# Patient Record
Sex: Female | Born: 1990 | Race: White | Hispanic: No | Marital: Married | Smoking: Never smoker
Health system: Southern US, Community
[De-identification: ages and names within clinical notes are randomized; demographics above are authoritative.]

## PROBLEM LIST (undated history)

## (undated) DIAGNOSIS — E039 Hypothyroidism, unspecified: Secondary | ICD-10-CM

## (undated) HISTORY — DX: Hypothyroidism, unspecified: E03.9

## (undated) HISTORY — PX: DILATION AND CURETTAGE OF UTERUS: SHX78

---

## 2018-10-02 ENCOUNTER — Other Ambulatory Visit: Payer: Self-pay | Admitting: Obstetrics and Gynecology

## 2018-10-02 DIAGNOSIS — N644 Mastodynia: Secondary | ICD-10-CM

## 2018-10-10 ENCOUNTER — Other Ambulatory Visit: Payer: Self-pay

## 2018-10-10 ENCOUNTER — Ambulatory Visit
Admission: RE | Admit: 2018-10-10 | Discharge: 2018-10-10 | Disposition: A | Discharge status: Home / Self Care | Payer: Federal, State, Local not specified - PPO | Source: Ambulatory Visit | Attending: Obstetrics and Gynecology | Admitting: Obstetrics and Gynecology

## 2018-10-10 DIAGNOSIS — N644 Mastodynia: Secondary | ICD-10-CM

## 2018-10-23 ENCOUNTER — Other Ambulatory Visit (HOSPITAL_COMMUNITY)
Admission: RE | Admit: 2018-10-23 | Discharge: 2018-10-23 | Disposition: A | Discharge status: Home / Self Care | Payer: Federal, State, Local not specified - PPO | Source: Ambulatory Visit | Attending: Obstetrics and Gynecology | Admitting: Obstetrics and Gynecology

## 2018-10-23 ENCOUNTER — Other Ambulatory Visit: Payer: Self-pay | Admitting: Obstetrics and Gynecology

## 2018-10-23 DIAGNOSIS — Z3480 Encounter for supervision of other normal pregnancy, unspecified trimester: Secondary | ICD-10-CM | POA: Diagnosis present

## 2018-10-23 LAB — OB RESULTS CONSOLE HEPATITIS B SURFACE ANTIGEN: Hepatitis B Surface Ag: NEGATIVE

## 2018-10-23 LAB — OB RESULTS CONSOLE RPR: RPR: NONREACTIVE

## 2018-10-23 LAB — OB RESULTS CONSOLE ABO/RH: RH Type: POSITIVE

## 2018-10-23 LAB — OB RESULTS CONSOLE GC/CHLAMYDIA
Chlamydia: NEGATIVE
Gonorrhea: NEGATIVE

## 2018-10-23 LAB — OB RESULTS CONSOLE HIV ANTIBODY (ROUTINE TESTING): HIV: NONREACTIVE

## 2018-10-23 LAB — OB RESULTS CONSOLE ANTIBODY SCREEN: Antibody Screen: NEGATIVE

## 2018-10-23 LAB — OB RESULTS CONSOLE RUBELLA ANTIBODY, IGM: Rubella: IMMUNE

## 2018-10-25 LAB — CYTOLOGY - PAP
Chlamydia: NEGATIVE
Diagnosis: NEGATIVE
Neisseria Gonorrhea: NEGATIVE

## 2019-02-28 NOTE — L&D Delivery Note (Signed)
Delivery Note At 7:06 AM a viable female was delivered via Vaginal, Spontaneous (Presentation: Left Occiput Anterior).  APGAR: 9, 9; weight 9 lb 6.1 oz (4255 g).   Placenta status: Spontaneous, Intact.  Cord: 3 vessels with the following complications: None.  Cord pH: NA  Anesthesia: None Episiotomy: None Lacerations: 1st degree Suture Repair: 3.0 vicryl Est. Blood Loss (mL): 200  Mom to postpartum.  Baby to Couplet care / Skin to Skin  .  Gerald Leitz 06/05/2019, 5:43 PM

## 2019-04-09 DIAGNOSIS — E039 Hypothyroidism, unspecified: Secondary | ICD-10-CM | POA: Diagnosis not present

## 2019-04-15 DIAGNOSIS — Z23 Encounter for immunization: Secondary | ICD-10-CM | POA: Diagnosis not present

## 2019-05-13 DIAGNOSIS — Z3483 Encounter for supervision of other normal pregnancy, third trimester: Secondary | ICD-10-CM | POA: Diagnosis not present

## 2019-06-03 ENCOUNTER — Encounter (HOSPITAL_COMMUNITY): Payer: Self-pay | Admitting: *Deleted

## 2019-06-03 ENCOUNTER — Telehealth (HOSPITAL_COMMUNITY): Payer: Self-pay | Admitting: *Deleted

## 2019-06-03 NOTE — Telephone Encounter (Signed)
Preadmission screen  

## 2019-06-05 ENCOUNTER — Inpatient Hospital Stay (HOSPITAL_COMMUNITY)
Admission: AD | Admit: 2019-06-05 | Discharge: 2019-06-06 | DRG: 807 | Disposition: A | Discharge status: Home / Self Care | Payer: Federal, State, Local not specified - PPO | Attending: Obstetrics and Gynecology | Admitting: Obstetrics and Gynecology

## 2019-06-05 ENCOUNTER — Encounter (HOSPITAL_COMMUNITY): Payer: Self-pay | Admitting: Obstetrics and Gynecology

## 2019-06-05 ENCOUNTER — Other Ambulatory Visit: Payer: Self-pay

## 2019-06-05 DIAGNOSIS — E039 Hypothyroidism, unspecified: Secondary | ICD-10-CM | POA: Diagnosis not present

## 2019-06-05 DIAGNOSIS — O99284 Endocrine, nutritional and metabolic diseases complicating childbirth: Principal | ICD-10-CM | POA: Diagnosis present

## 2019-06-05 DIAGNOSIS — Z20822 Contact with and (suspected) exposure to covid-19: Secondary | ICD-10-CM | POA: Diagnosis present

## 2019-06-05 DIAGNOSIS — Z3A39 39 weeks gestation of pregnancy: Secondary | ICD-10-CM | POA: Diagnosis not present

## 2019-06-05 DIAGNOSIS — O26893 Other specified pregnancy related conditions, third trimester: Secondary | ICD-10-CM | POA: Diagnosis not present

## 2019-06-05 LAB — CBC
HCT: 36 % (ref 36.0–46.0)
Hemoglobin: 11 g/dL — ABNORMAL LOW (ref 12.0–15.0)
MCH: 24.9 pg — ABNORMAL LOW (ref 26.0–34.0)
MCHC: 30.6 g/dL (ref 30.0–36.0)
MCV: 81.4 fL (ref 80.0–100.0)
Platelets: 172 10*3/uL (ref 150–400)
RBC: 4.42 MIL/uL (ref 3.87–5.11)
RDW: 15.8 % — ABNORMAL HIGH (ref 11.5–15.5)
WBC: 10.9 10*3/uL — ABNORMAL HIGH (ref 4.0–10.5)
nRBC: 0 % (ref 0.0–0.2)

## 2019-06-05 LAB — ABO/RH: ABO/RH(D): O POS

## 2019-06-05 LAB — RESPIRATORY PANEL BY RT PCR (FLU A&B, COVID)
Influenza A by PCR: NEGATIVE
Influenza B by PCR: NEGATIVE
SARS Coronavirus 2 by RT PCR: NEGATIVE

## 2019-06-05 LAB — RPR: RPR Ser Ql: NONREACTIVE

## 2019-06-05 LAB — TYPE AND SCREEN
ABO/RH(D): O POS
Antibody Screen: NEGATIVE

## 2019-06-05 MED ORDER — METHYLERGONOVINE MALEATE 0.2 MG/ML IJ SOLN: 0.2000 mg | INTRAMUSCULAR | Status: DC | PRN

## 2019-06-05 MED ORDER — SOD CITRATE-CITRIC ACID 500-334 MG/5ML PO SOLN: 30.0000 mL | ORAL | Status: DC | PRN

## 2019-06-05 MED ORDER — ZOLPIDEM TARTRATE 5 MG PO TABS: 5.0000 mg | ORAL_TABLET | Freq: Every evening | ORAL | Status: DC | PRN

## 2019-06-05 MED ORDER — OXYTOCIN BOLUS FROM INFUSION
500.0000 mL | Freq: Once | INTRAVENOUS | Status: AC
  Administered 2019-06-05: 07:00:00 500 mL via INTRAVENOUS

## 2019-06-05 MED ORDER — ONDANSETRON HCL 4 MG/2ML IJ SOLN
4.0000 mg | Freq: Four times a day (QID) | INTRAMUSCULAR | Status: DC | PRN
  Administered 2019-06-05: 05:00:00 4 mg via INTRAVENOUS
  Filled 2019-06-05 (×2): qty 2

## 2019-06-05 MED ORDER — DIBUCAINE (PERIANAL) 1 % EX OINT: 1.0000 "application " | TOPICAL_OINTMENT | CUTANEOUS | Status: DC | PRN

## 2019-06-05 MED ORDER — COCONUT OIL OIL: 1.0000 "application " | TOPICAL_OIL | Status: DC | PRN

## 2019-06-05 MED ORDER — SIMETHICONE 80 MG PO CHEW: 80.0000 mg | CHEWABLE_TABLET | ORAL | Status: DC | PRN

## 2019-06-05 MED ORDER — SENNOSIDES-DOCUSATE SODIUM 8.6-50 MG PO TABS
2.0000 | ORAL_TABLET | ORAL | Status: DC
  Administered 2019-06-05: 2 via ORAL
  Filled 2019-06-05: qty 2

## 2019-06-05 MED ORDER — OXYCODONE-ACETAMINOPHEN 5-325 MG PO TABS
1.0000 | ORAL_TABLET | ORAL | Status: DC | PRN
  Administered 2019-06-05: 1 via ORAL
  Filled 2019-06-05: qty 1

## 2019-06-05 MED ORDER — PRENATAL MULTIVITAMIN CH
1.0000 | ORAL_TABLET | Freq: Every day | ORAL | Status: DC
  Administered 2019-06-05 – 2019-06-06 (×2): 1 via ORAL
  Filled 2019-06-05 (×2): qty 1

## 2019-06-05 MED ORDER — IBUPROFEN 600 MG PO TABS
600.0000 mg | ORAL_TABLET | Freq: Four times a day (QID) | ORAL | Status: DC
  Administered 2019-06-05 – 2019-06-06 (×5): 600 mg via ORAL
  Filled 2019-06-05 (×5): qty 1

## 2019-06-05 MED ORDER — ACETAMINOPHEN 325 MG PO TABS
650.0000 mg | ORAL_TABLET | ORAL | Status: DC | PRN
  Administered 2019-06-05: 650 mg via ORAL
  Filled 2019-06-05: qty 2

## 2019-06-05 MED ORDER — ONDANSETRON HCL 4 MG PO TABS
4.0000 mg | ORAL_TABLET | ORAL | Status: DC | PRN
  Administered 2019-06-05: 12:00:00 4 mg via ORAL
  Filled 2019-06-05: qty 1

## 2019-06-05 MED ORDER — WITCH HAZEL-GLYCERIN EX PADS: 1.0000 "application " | MEDICATED_PAD | CUTANEOUS | Status: DC | PRN

## 2019-06-05 MED ORDER — LIDOCAINE HCL (PF) 1 % IJ SOLN
30.0000 mL | INTRAMUSCULAR | Status: AC | PRN
  Administered 2019-06-05: 07:00:00 30 mL via SUBCUTANEOUS
  Filled 2019-06-05: qty 30

## 2019-06-05 MED ORDER — LACTATED RINGERS IV SOLN: INTRAVENOUS | Status: DC

## 2019-06-05 MED ORDER — MAGNESIUM HYDROXIDE 400 MG/5ML PO SUSP: 30.0000 mL | ORAL | Status: DC | PRN

## 2019-06-05 MED ORDER — ACETAMINOPHEN 325 MG PO TABS: 650.0000 mg | ORAL_TABLET | ORAL | Status: DC | PRN

## 2019-06-05 MED ORDER — ONDANSETRON HCL 4 MG/2ML IJ SOLN
4.0000 mg | Freq: Once | INTRAMUSCULAR | Status: AC
  Administered 2019-06-05: 4 mg via INTRAVENOUS

## 2019-06-05 MED ORDER — LACTATED RINGERS IV SOLN: 500.0000 mL | INTRAVENOUS | Status: DC | PRN

## 2019-06-05 MED ORDER — FLEET ENEMA 7-19 GM/118ML RE ENEM: 1.0000 | ENEMA | RECTAL | Status: DC | PRN

## 2019-06-05 MED ORDER — OXYTOCIN 40 UNITS IN NORMAL SALINE INFUSION - SIMPLE MED
2.5000 [IU]/h | INTRAVENOUS | Status: DC
  Filled 2019-06-05: qty 1000

## 2019-06-05 MED ORDER — DIPHENHYDRAMINE HCL 25 MG PO CAPS: 25.0000 mg | ORAL_CAPSULE | Freq: Four times a day (QID) | ORAL | Status: DC | PRN

## 2019-06-05 MED ORDER — PROMETHAZINE HCL 25 MG/ML IJ SOLN
12.5000 mg | INTRAMUSCULAR | Status: DC | PRN
  Administered 2019-06-05: 12.5 mg via INTRAVENOUS
  Filled 2019-06-05: qty 1

## 2019-06-05 MED ORDER — BENZOCAINE-MENTHOL 20-0.5 % EX AERO
1.0000 "application " | INHALATION_SPRAY | CUTANEOUS | Status: DC | PRN
  Administered 2019-06-05: 1 via TOPICAL
  Filled 2019-06-05: qty 56

## 2019-06-05 MED ORDER — OXYCODONE-ACETAMINOPHEN 5-325 MG PO TABS: 2.0000 | ORAL_TABLET | ORAL | Status: DC | PRN

## 2019-06-05 MED ORDER — METHYLERGONOVINE MALEATE 0.2 MG PO TABS: 0.2000 mg | ORAL_TABLET | ORAL | Status: DC | PRN

## 2019-06-05 MED ORDER — FENTANYL CITRATE (PF) 100 MCG/2ML IJ SOLN: 50.0000 ug | INTRAMUSCULAR | Status: DC | PRN

## 2019-06-05 MED ORDER — ONDANSETRON HCL 4 MG/2ML IJ SOLN: 4.0000 mg | INTRAMUSCULAR | Status: DC | PRN

## 2019-06-05 NOTE — MAU Note (Signed)
Covid swab obtained without difficulty and pt tol well. No symptoms 

## 2019-06-05 NOTE — MAU Note (Signed)
.   Caitlin Aguirre is a 29 y.o. at [redacted]w[redacted]d here in MAU reporting: Ctx that started around 0130 this morning. No VB or LOF. Patient reporting good fetal movement.   Vitals:   06/05/19 0324  BP: 115/64  Pulse: 71  Resp: 19  Temp: 97.7 F (36.5 C)  SpO2: 100%     FHT:140 Lab orders placed from triage:

## 2019-06-05 NOTE — H&P (Signed)
Caitlin Aguirre is a 29 y.o. female presenting for active labor. Presented toMAU and was 5 cm progressed rapidly to complete. No LOF + bloody show +FM.Marland Kitchenpregnancy has been uncomplicated. Prenatal care provided by Dr. Gerald Leitz with [facility]  OB History    Gravida  5   Para  3   Term  3   Preterm      AB  2   Living  3     SAB  2   TAB      Ectopic      Multiple  0   Live Births  3        Obstetric Comments  1500 cc blood loss from 2019 pregnancy, then retained placenta-led to [facility]        Past Medical History:  Diagnosis Date  . Hypothyroidism    Past Surgical History:  Procedure Laterality Date  . DILATION AND CURETTAGE OF UTERUS     Family History: family history is not on file. Social History:  reports previous alcohol use. She reports that she does not use drugs. No history on file for tobacco.     Maternal Diabetes: No Genetic Screening: Normal Maternal Ultrasounds/Referrals: Normal Fetal Ultrasounds or other Referrals:  None Maternal Substance Abuse:  No Significant Maternal Medications:  Meds include: Syntroid Significant Maternal Lab Results:  Group B Strep negative Other Comments:  None  Review of Systems  Constitutional: Negative.   HENT: Negative.   Eyes: Negative.   Respiratory: Negative.   Cardiovascular: Negative.   Gastrointestinal: Negative.   Endocrine: Negative.   Genitourinary: Negative.   Musculoskeletal: Negative.   Skin: Negative.   Allergic/Immunologic: Negative.   Neurological: Negative.   Hematological: Negative.   Psychiatric/Behavioral: Negative.    Maternal Medical History:  Reason for admission: Contractions.   Contractions: Onset was 6-12 hours ago.   Frequency: regular.   Perceived severity is strong.    Fetal activity: Perceived fetal activity is normal.   Last perceived fetal movement was within the past hour.    Prenatal complications: no prenatal complications Prenatal Complications - Diabetes:  none.    Dilation: 10 Effacement (%): 100 Station: 0 Exam by:: Lajuana Matte, RN Blood pressure 112/64, pulse 74, temperature 98.1 F (36.7 C), temperature source Oral, resp. rate 16, height 5\' 10"  (1.778 m), weight 93.4 kg, last menstrual period 09/01/2018, SpO2 99 %, unknown if currently breastfeeding. Exam Physical Exam  Vitals reviewed. Constitutional: She is oriented to person, place, and time. She appears well-developed and well-nourished.  HENT:  Head: Normocephalic and atraumatic.  Eyes: Pupils are equal, round, and reactive to light. Conjunctivae are normal.  Cardiovascular: Normal rate and regular rhythm.  Respiratory: Effort normal and breath sounds normal.  GI: There is no abdominal tenderness.  Genitourinary:    Vagina normal.   Musculoskeletal:        General: Edema present. Normal range of motion.     Cervical back: Normal range of motion.  Neurological: She is alert and oriented to person, place, and time.  Skin: Skin is warm and dry.  Psychiatric: She has a normal mood and affect.    Prenatal labs: ABO, Rh: --/--/O POS, O POS Performed at [facility], [address] [place], [place]  [phone]) Antibody: NEG (04/08 0510) Rubella: Immune (08/26 0000) RPR: NON REACTIVE (04/08 0515)  HBsAg: Negative (08/26 0000)  HIV: Non-reactive (08/26 0000)  GBS:   Negative   Assessment/Plan: Term Pregnancy - Active labor  Complete and ready to push upon by arrival .  Please see delivery summary Pain control - none Hypothyroidism- synthroid  Christophe Louis 06/05/2019, 5:38 PM

## 2019-06-05 NOTE — Lactation Note (Signed)
This note was copied from a baby's chart. Lactation Consultation Note  Patient Name: Caitlin Aguirre [id number] Date: 06/05/2019 Reason for consult: Initial assessment;Term;Maternal endocrine disorder Type of Endocrine Disorder?: Thyroid   Experienced with breastfeeding first 2 children for >1 yr.  LC in to visit with P3 Mom of term baby at 63 hrs old.  Baby has breastfed 3 times so far and Mom reports feeling some tenderness during initial latch.  Mom has small breasts with erect nipples, no trauma noted to nipples.   Reviewed breast massage and hand expression.  Mom has an easy flow of colostrum noted.    Baby swaddled loosely in crib and stirring awake.  Changed a void and large stool (transitional).  Baby given to Mom STS.  Mom using cradle hold and baby latched shallow.  Offered to assist with a deeper latch to breast.  Added pillow support under baby, and assisted Mom to use cross cradle hold and baby latched deeper and swallows identified almost immediately.   Encouraged continued STS and offering the breast often with cues.    Lactation brochure given and Mom aware of IP and OP lactation support available to her.  Mom to call prn.   Maternal Data Formula Feeding for Exclusion: No Has patient been taught Hand Expression?: Yes Does the patient have breastfeeding experience prior to this delivery?: Yes  Feeding Feeding Type: Breast Fed  LATCH Score Latch: Grasps breast easily, tongue down, lips flanged, rhythmical sucking.  Audible Swallowing: Spontaneous and intermittent  Type of Nipple: Everted at rest and after stimulation  Comfort (Breast/Nipple): Soft / non-tender  Hold (Positioning): Assistance needed to correctly position infant at breast and maintain latch.  LATCH Score: 9  Interventions Interventions: Breast feeding basics reviewed;Assisted with latch;Skin to skin;Breast massage;Hand express;Breast compression;Adjust position;Support pillows;Position  options;Expressed milk   Consult Status Consult Status: Follow-up Date: 06/06/19 Follow-up type: In-patient    [name] 06/05/2019, 3:07 PM

## 2019-06-06 LAB — CBC
HCT: 35.8 % — ABNORMAL LOW (ref 36.0–46.0)
Hemoglobin: 10.7 g/dL — ABNORMAL LOW (ref 12.0–15.0)
MCH: 24.8 pg — ABNORMAL LOW (ref 26.0–34.0)
MCHC: 29.9 g/dL — ABNORMAL LOW (ref 30.0–36.0)
MCV: 82.9 fL (ref 80.0–100.0)
Platelets: 234 10*3/uL (ref 150–400)
RBC: 4.32 MIL/uL (ref 3.87–5.11)
RDW: 16 % — ABNORMAL HIGH (ref 11.5–15.5)
WBC: 15.9 10*3/uL — ABNORMAL HIGH (ref 4.0–10.5)
nRBC: 0 % (ref 0.0–0.2)

## 2019-06-06 MED ORDER — IBUPROFEN 600 MG PO TABS: 600.0000 mg | ORAL_TABLET | Freq: Four times a day (QID) | ORAL | 0 refills | Status: DC

## 2019-06-06 NOTE — Lactation Note (Signed)
This note was copied from a baby's chart. Lactation Consultation Note  Patient Name: Caitlin Aguirre [id number] Date: 06/06/2019    Gaylyn Rong, RN says that Mom declines a lactation visit before going home.   Lurline Hare [facility] 06/06/2019, 11:24 AM

## 2019-06-06 NOTE — Discharge Instructions (Signed)

## 2019-06-06 NOTE — Discharge Summary (Signed)
OB Discharge Summary     Patient Name: Caitlin Aguirre DOB: March 17, 1990 MRN: [id number]  Date of admission: 06/05/2019 Delivering MD: Gerald Leitz   Date of discharge: 06/06/2019  Admitting diagnosis: Normal labor and delivery [O80] Vaginal delivery [O80] Intrauterine pregnancy: [redacted]w[redacted]d     Secondary diagnosis:  Active Problems:   Normal labor and delivery   Vaginal delivery  Additional problems: hypothyroid     Discharge diagnosis: Term Pregnancy Delivered                                                                                                Post partum procedures:none  Augmentation: none  Complications: None  Hospital course:  Onset of Labor With Vaginal Delivery     29 y.o. yo [id number] at [redacted]w[redacted]d was admitted in Active Labor on 06/05/2019. Patient had an uncomplicated labor course as follows:  Membrane Rupture Time/Date: 6:50 AM ,06/05/2019   Intrapartum Procedures: Episiotomy: None [1]                                         Lacerations:  1st degree [2]  Patient had a delivery of a Viable infant. 06/05/2019  Information for the patient's newborn:  Jakyah, Bradby [[id number]]  Delivery Method: Vaginal, Spontaneous(Filed from Delivery Summary)     Pateint had an uncomplicated postpartum course.  She is ambulating, tolerating a regular diet, passing flatus, and urinating well. Patient is discharged home in stable condition on 06/06/19.   Physical exam  Vitals:   06/05/19 1600 06/05/19 2000 06/06/19 0000 06/06/19 0500  BP: 112/64 115/72 110/63 106/69  Pulse: 74 (!) 58 73 75  Resp: 16 18 18 16   Temp: 98.1 F (36.7 C) 98.1 F (36.7 C) 99.5 F (37.5 C) 97.9 F (36.6 C)  TempSrc: Oral Oral Oral Oral  SpO2: 99% 99% 97% 98%  Weight:      Height:       General: alert, cooperative and no distress Lochia: appropriate Uterine Fundus: firm Incision: N/A DVT Evaluation: No evidence of DVT seen on physical exam. Labs: Lab Results  Component Value Date   WBC 15.9 (H)  06/06/2019   HGB 10.7 (L) 06/06/2019   HCT 35.8 (L) 06/06/2019   MCV 82.9 06/06/2019   PLT 234 06/06/2019   No flowsheet data found.  Discharge instruction: per After Visit Summary and "Baby and Me Booklet".  After visit meds:  Allergies as of 06/06/2019   No Known Allergies     Medication List    TAKE these medications   ibuprofen 600 MG tablet Commonly known as: ADVIL Take 1 tablet (600 mg total) by mouth every 6 (six) hours.   levothyroxine 25 MCG tablet Commonly known as: SYNTHROID Take 25 mcg by mouth daily before breakfast.   prenatal multivitamin Tabs tablet Take 1 tablet by mouth daily at 12 noon.       Diet: routine diet  Activity: Advance as tolerated. Pelvic rest for 6 weeks.   Outpatient follow up:6 weeks Follow  up Appt: Future Appointments  Date Time Provider [place]  06/14/2019  9:45 AM [facility] None   Follow up Visit:No follow-ups on file.  Postpartum contraception: Not Discussed  Newborn Data: Live born female  Birth Weight: 9 lb 6.1 oz (4255 g) APGAR: 9, 9  Newborn Delivery   Birth date/time: 06/05/2019 07:06:00 Delivery type: Vaginal, Spontaneous      Baby Feeding: Breast Disposition:home with mother   06/06/2019 Annalee Genta, DO

## 2019-06-14 ENCOUNTER — Other Ambulatory Visit (HOSPITAL_COMMUNITY): Payer: Federal, State, Local not specified - PPO

## 2019-06-16 ENCOUNTER — Inpatient Hospital Stay (HOSPITAL_COMMUNITY): Payer: Federal, State, Local not specified - PPO

## 2019-06-16 ENCOUNTER — Inpatient Hospital Stay (HOSPITAL_COMMUNITY)
Admission: AD | Admit: 2019-06-16 | Payer: Federal, State, Local not specified - PPO | Source: Home / Self Care | Admitting: Obstetrics and Gynecology

## 2019-07-15 DIAGNOSIS — Z8742 Personal history of other diseases of the female genital tract: Secondary | ICD-10-CM | POA: Diagnosis not present

## 2019-08-01 DIAGNOSIS — N61 Mastitis without abscess: Secondary | ICD-10-CM | POA: Diagnosis not present

## 2019-11-04 DIAGNOSIS — E039 Hypothyroidism, unspecified: Secondary | ICD-10-CM | POA: Diagnosis not present

## 2019-11-04 DIAGNOSIS — Z1321 Encounter for screening for nutritional disorder: Secondary | ICD-10-CM | POA: Diagnosis not present

## 2019-11-04 DIAGNOSIS — Z6824 Body mass index (BMI) 24.0-24.9, adult: Secondary | ICD-10-CM | POA: Diagnosis not present

## 2019-12-08 DIAGNOSIS — Z20828 Contact with and (suspected) exposure to other viral communicable diseases: Secondary | ICD-10-CM | POA: Diagnosis not present

## 2019-12-08 DIAGNOSIS — R051 Acute cough: Secondary | ICD-10-CM | POA: Diagnosis not present

## 2019-12-08 DIAGNOSIS — J069 Acute upper respiratory infection, unspecified: Secondary | ICD-10-CM | POA: Diagnosis not present

## 2020-03-09 DIAGNOSIS — U071 COVID-19: Secondary | ICD-10-CM | POA: Diagnosis not present

## 2020-03-09 DIAGNOSIS — Z20828 Contact with and (suspected) exposure to other viral communicable diseases: Secondary | ICD-10-CM | POA: Diagnosis not present

## 2020-03-09 DIAGNOSIS — H1033 Unspecified acute conjunctivitis, bilateral: Secondary | ICD-10-CM | POA: Diagnosis not present

## 2020-07-30 ENCOUNTER — Other Ambulatory Visit: Payer: Self-pay | Admitting: Family Medicine

## 2020-07-30 DIAGNOSIS — M79604 Pain in right leg: Secondary | ICD-10-CM | POA: Diagnosis not present

## 2020-07-30 DIAGNOSIS — R2242 Localized swelling, mass and lump, left lower limb: Secondary | ICD-10-CM | POA: Diagnosis not present

## 2020-07-30 DIAGNOSIS — M79605 Pain in left leg: Secondary | ICD-10-CM | POA: Diagnosis not present

## 2020-08-04 DIAGNOSIS — I82812 Embolism and thrombosis of superficial veins of left lower extremities: Secondary | ICD-10-CM | POA: Diagnosis not present

## 2020-08-04 DIAGNOSIS — R2242 Localized swelling, mass and lump, left lower limb: Secondary | ICD-10-CM | POA: Diagnosis not present

## 2020-09-10 ENCOUNTER — Other Ambulatory Visit: Payer: Self-pay

## 2020-09-10 DIAGNOSIS — I872 Venous insufficiency (chronic) (peripheral): Secondary | ICD-10-CM

## 2020-09-17 ENCOUNTER — Other Ambulatory Visit: Payer: Self-pay

## 2020-09-17 ENCOUNTER — Ambulatory Visit: Payer: Federal, State, Local not specified - PPO | Admitting: Physician Assistant

## 2020-09-17 ENCOUNTER — Ambulatory Visit (HOSPITAL_COMMUNITY)
Admission: RE | Admit: 2020-09-17 | Discharge: 2020-09-17 | Disposition: A | Discharge status: Home / Self Care | Payer: Federal, State, Local not specified - PPO | Source: Ambulatory Visit | Attending: Vascular Surgery | Admitting: Vascular Surgery

## 2020-09-17 VITALS — BP 107/65 | HR 67 | Temp 98.2°F | Resp 20 | Ht 70.0 in | Wt 155.8 lb

## 2020-09-17 DIAGNOSIS — I872 Venous insufficiency (chronic) (peripheral): Secondary | ICD-10-CM | POA: Diagnosis not present

## 2020-09-17 DIAGNOSIS — I83812 Varicose veins of left lower extremities with pain: Secondary | ICD-10-CM

## 2020-09-17 NOTE — Progress Notes (Signed)
Office Note     CC:  follow up Requesting Provider:  Eber Jones, NP  HPI: Caitlin Aguirre is a 30 y.o. (01-06-1991) female who presents for evaluation of varicosities of left lower extremity.  Patient describes a painful episode lasting 5 days in a focal area of her medial left thigh which she believes is a varicose vein.  This affected her ability to walk and she was worried about blood clots.  She denies any history of DVT, venous ulcerations, trauma, or prior vascular intervention.  She has a strong family history of varicose veins in both sides of her family.  She denies tobacco use.   Past Medical History:  Diagnosis Date   Hypothyroidism     Past Surgical History:  Procedure Laterality Date   DILATION AND CURETTAGE OF UTERUS      Social History   Socioeconomic History   Marital status: Unknown    Spouse name: Not on file   Number of children: Not on file   Years of education: Not on file   Highest education level: Not on file  Occupational History   Not on file  Tobacco Use   Smoking status: Never   Smokeless tobacco: Never  Substance and Sexual Activity   Alcohol use: Not Currently   Drug use: Never   Sexual activity: Not on file  Other Topics Concern   Not on file  Social History Narrative   Not on file   Social Determinants of Health   Financial Resource Strain: Not on file  Food Insecurity: Not on file  Transportation Needs: Not on file  Physical Activity: Not on file  Stress: Not on file  Social Connections: Not on file  Intimate Partner Violence: Not on file   History reviewed. No pertinent family history.  Current Outpatient Medications  Medication Sig Dispense Refill   levothyroxine (SYNTHROID) 25 MCG tablet Take 25 mcg by mouth daily before breakfast.     Prenatal Vit-Fe Fumarate-FA (PRENATAL MULTIVITAMIN) TABS tablet Take 1 tablet by mouth daily at 12 noon.     ibuprofen (ADVIL) 600 MG tablet Take 1 tablet (600 mg total) by mouth every 6  (six) hours. (Patient not taking: Reported on 09/17/2020) 30 tablet 0   No current facility-administered medications for this visit.    No Known Allergies   REVIEW OF SYSTEMS:   [X]  denotes positive finding, [ ]  denotes negative finding Cardiac  Comments:  Chest pain or chest pressure:    Shortness of breath upon exertion:    Short of breath when lying flat:    Irregular heart rhythm:        Vascular    Pain in calf, thigh, or hip brought on by ambulation:    Pain in feet at night that wakes you up from your sleep:     Blood clot in your veins:    Leg swelling:         Pulmonary    Oxygen at home:    Productive cough:     Wheezing:         Neurologic    Sudden weakness in arms or legs:     Sudden numbness in arms or legs:     Sudden onset of difficulty speaking or slurred speech:    Temporary loss of vision in one eye:     Problems with dizziness:         Gastrointestinal    Blood in stool:     Vomited blood:  Genitourinary    Burning when urinating:     Blood in urine:        Psychiatric    Major depression:         Hematologic    Bleeding problems:    Problems with blood clotting too easily:        Skin    Rashes or ulcers:        Constitutional    Fever or chills:      PHYSICAL EXAMINATION:  Vitals:   09/17/20 1415  BP: 107/65  Pulse: 67  Resp: 20  Temp: 98.2 F (36.8 C)  TempSrc: Temporal  SpO2: 98%  Weight: 155 lb 12.8 oz (70.7 kg)  Height: 5\' 10"  (1.778 m)    General:  WDWN in NAD; vital signs documented above Gait: Not observed HENT: WNL, normocephalic Pulmonary: normal non-labored breathing  Cardiac: regular HR Abdomen: soft, NT, no masses Skin: without rashes Vascular Exam/Pulses:  Right Left  Radial 2+ (normal) 2+ (normal)  DP 2+ (normal) 2+ (normal)   Extremities: without ischemic changes, without Gangrene , without cellulitis; without open wounds; varicose vein palpable in medial left thigh as well as in  medial/posterior left calf Musculoskeletal: no muscle wasting or atrophy  Neurologic: A&O X 3;  No focal weakness or paresthesias are detected Psychiatric:  The pt has Normal affect.   Non-Invasive Vascular Imaging:   Incompetent left greater saphenous vein Venous reflux study negative for DVT of left lower extremity   ASSESSMENT/PLAN:: 30 y.o. female here for evaluation of varicose veins of left lower extremity  -Patient describes periodic painful episodes and a focal area of her left medial thigh consistent with superficial thrombophlebitis -Venous reflux study was negative for DVT of left lower extremity however did demonstrate a incompetent left greater saphenous vein -At this time patient is not interested in pursuing any further work-up for incompetent left greater saphenous vein.  She was provided measurements for knee-high compression if she would like to purchase compression stockings in the future.  We also discussed proper leg elevation as well as avoiding prolonged sitting and standing and taking NSAIDs if she were to experience another episode of thrombophlebitis -She may follow-up on a as needed basis for now.  We discussed if she would like to pursue treatment of left greater saphenous vein in the future she would need a current reflux study   38, PA-C [facility] [phone]  Clinic MD:   [phone]

## 2020-10-12 DIAGNOSIS — R519 Headache, unspecified: Secondary | ICD-10-CM | POA: Diagnosis not present

## 2020-10-12 DIAGNOSIS — G9389 Other specified disorders of brain: Secondary | ICD-10-CM | POA: Diagnosis not present

## 2020-10-12 DIAGNOSIS — R197 Diarrhea, unspecified: Secondary | ICD-10-CM | POA: Diagnosis not present

## 2020-10-12 DIAGNOSIS — M542 Cervicalgia: Secondary | ICD-10-CM | POA: Diagnosis not present

## 2020-10-12 DIAGNOSIS — R42 Dizziness and giddiness: Secondary | ICD-10-CM | POA: Diagnosis not present

## 2020-10-12 DIAGNOSIS — G44209 Tension-type headache, unspecified, not intractable: Secondary | ICD-10-CM | POA: Diagnosis not present

## 2021-06-07 DIAGNOSIS — Z3202 Encounter for pregnancy test, result negative: Secondary | ICD-10-CM | POA: Diagnosis not present

## 2021-06-07 DIAGNOSIS — Z6823 Body mass index (BMI) 23.0-23.9, adult: Secondary | ICD-10-CM | POA: Diagnosis not present

## 2021-06-07 DIAGNOSIS — D649 Anemia, unspecified: Secondary | ICD-10-CM | POA: Diagnosis not present

## 2021-06-07 DIAGNOSIS — R5383 Other fatigue: Secondary | ICD-10-CM | POA: Diagnosis not present

## 2021-06-07 DIAGNOSIS — E039 Hypothyroidism, unspecified: Secondary | ICD-10-CM | POA: Diagnosis not present

## 2021-07-06 IMAGING — US ULTRASOUND LEFT BREAST LIMITED
1 series · 2 of 2 positions shown · non-contrast
Comparison: Previous exam(s).

CLINICAL DATA: Patient presents for focal pain for 3-4 weeks and
lump in the left axillary tail. Patient states the lump is no longer
present and the pain is improving.

EXAM:
ULTRASOUND OF THE LEFT BREAST

[Series 1: ultrasound left breast limited · 0.06mm/px · 2 of 2 slices shown]
[im 1/2]
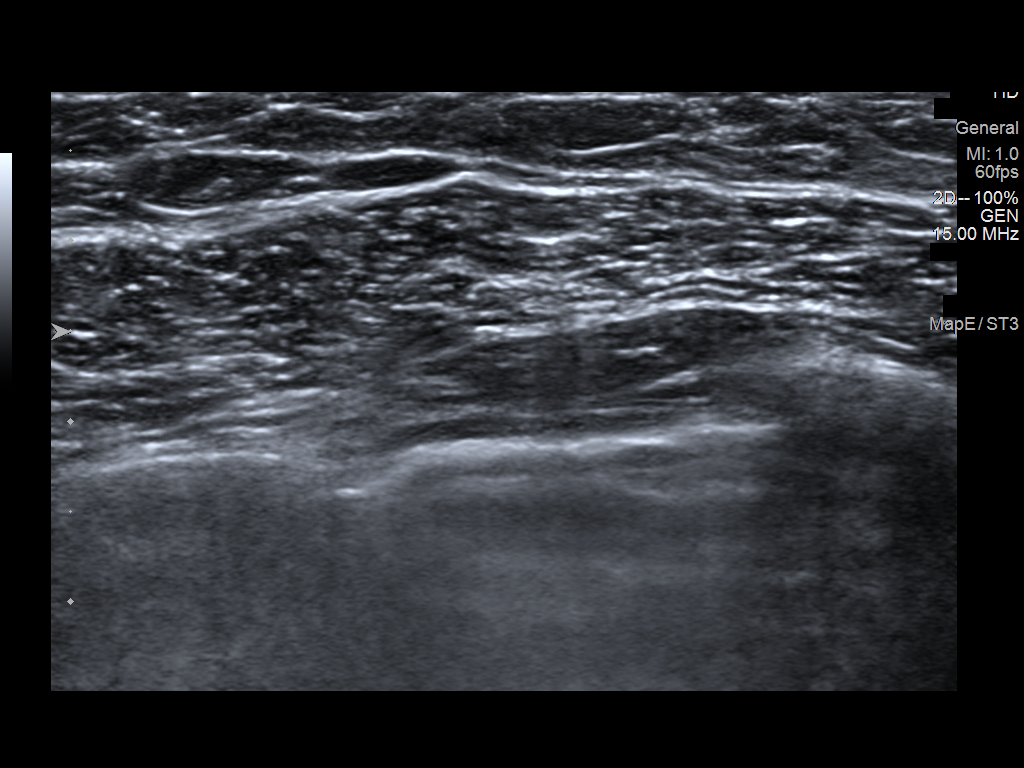
[im 2/2]
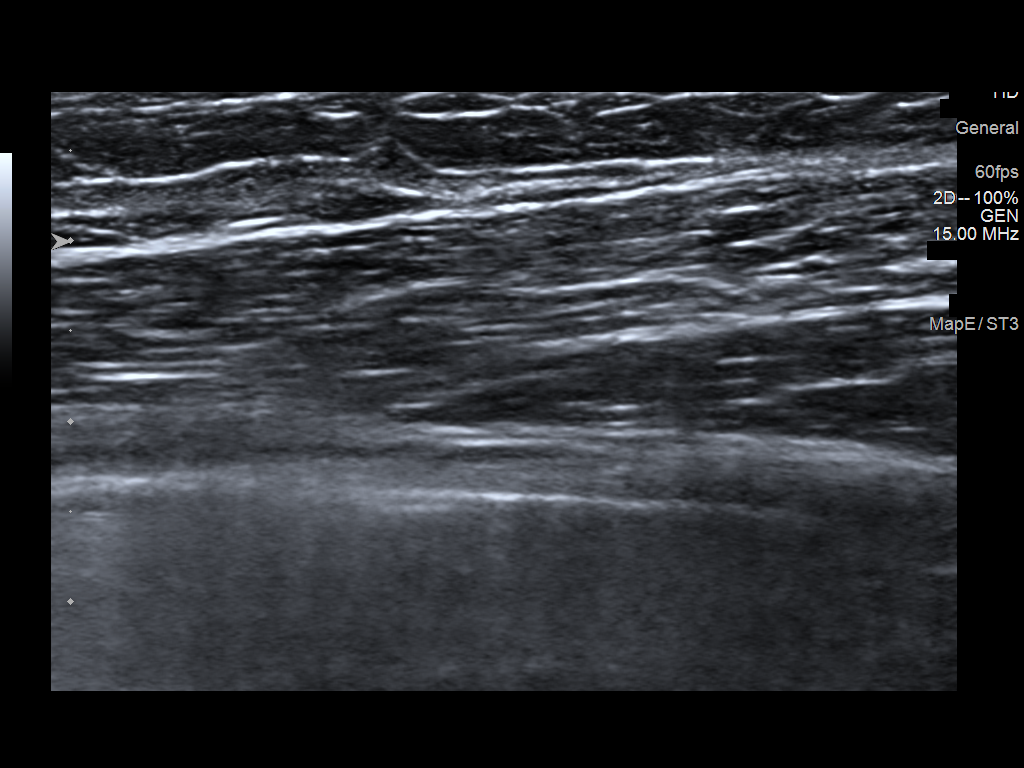

[2 of 2 positions shown; findings below may reference images not displayed]

FINDINGS: On physical exam, I palpated no discrete abnormality in the left
axillary tail.

Targeted ultrasound is performed, showing normal fibroglandular
tissue at 1 o'clock 8 cm from the nipple/axillary tail in the
location of patient's pain and reported lump that has resolved.
IMPRESSION: No sonographic evidence of malignancy in the left breast at the site
of the patient's reported pain and now resolved lump.

RECOMMENDATION:
Recommend further evaluation be performed on a clinical basis.

I have discussed the findings and recommendations with the patient.
Results were also provided in writing at the conclusion of the
visit. If applicable, a reminder letter will be sent to the patient
regarding the next appointment.

BI-RADS CATEGORY  1: Negative.

## 2021-07-26 DIAGNOSIS — Z6824 Body mass index (BMI) 24.0-24.9, adult: Secondary | ICD-10-CM | POA: Diagnosis not present

## 2021-07-26 DIAGNOSIS — D509 Iron deficiency anemia, unspecified: Secondary | ICD-10-CM | POA: Diagnosis not present

## 2021-07-26 DIAGNOSIS — N644 Mastodynia: Secondary | ICD-10-CM | POA: Diagnosis not present

## 2021-09-14 DIAGNOSIS — M5442 Lumbago with sciatica, left side: Secondary | ICD-10-CM | POA: Diagnosis not present

## 2021-09-14 DIAGNOSIS — M546 Pain in thoracic spine: Secondary | ICD-10-CM | POA: Diagnosis not present

## 2021-09-14 DIAGNOSIS — M5413 Radiculopathy, cervicothoracic region: Secondary | ICD-10-CM | POA: Diagnosis not present

## 2021-10-03 DIAGNOSIS — L814 Other melanin hyperpigmentation: Secondary | ICD-10-CM | POA: Diagnosis not present

## 2021-10-03 DIAGNOSIS — D225 Melanocytic nevi of trunk: Secondary | ICD-10-CM | POA: Diagnosis not present

## 2021-10-03 DIAGNOSIS — D485 Neoplasm of uncertain behavior of skin: Secondary | ICD-10-CM | POA: Diagnosis not present

## 2021-10-03 DIAGNOSIS — D2239 Melanocytic nevi of other parts of face: Secondary | ICD-10-CM | POA: Diagnosis not present

## 2021-10-10 DIAGNOSIS — M5413 Radiculopathy, cervicothoracic region: Secondary | ICD-10-CM | POA: Diagnosis not present

## 2021-10-10 DIAGNOSIS — M5442 Lumbago with sciatica, left side: Secondary | ICD-10-CM | POA: Diagnosis not present

## 2021-10-10 DIAGNOSIS — M546 Pain in thoracic spine: Secondary | ICD-10-CM | POA: Diagnosis not present

## 2021-10-17 DIAGNOSIS — M546 Pain in thoracic spine: Secondary | ICD-10-CM | POA: Diagnosis not present

## 2021-10-17 DIAGNOSIS — M5442 Lumbago with sciatica, left side: Secondary | ICD-10-CM | POA: Diagnosis not present

## 2021-10-17 DIAGNOSIS — M5413 Radiculopathy, cervicothoracic region: Secondary | ICD-10-CM | POA: Diagnosis not present

## 2021-10-24 DIAGNOSIS — M5413 Radiculopathy, cervicothoracic region: Secondary | ICD-10-CM | POA: Diagnosis not present

## 2021-10-24 DIAGNOSIS — M5442 Lumbago with sciatica, left side: Secondary | ICD-10-CM | POA: Diagnosis not present

## 2021-10-24 DIAGNOSIS — M546 Pain in thoracic spine: Secondary | ICD-10-CM | POA: Diagnosis not present

## 2021-11-04 ENCOUNTER — Telehealth: Payer: Self-pay

## 2021-11-04 DIAGNOSIS — I83812 Varicose veins of left lower extremities with pain: Secondary | ICD-10-CM

## 2021-11-04 DIAGNOSIS — I872 Venous insufficiency (chronic) (peripheral): Secondary | ICD-10-CM

## 2021-11-04 NOTE — Telephone Encounter (Signed)
Pt called stating that she had a blood clot in her leg and was having significant pain.  Reviewed pt's chart, returned call for clarification, two identifiers used. Informed pt that her last [place] in 7/22 showed no DVT, but an incompetent GSV. She stated that she also has a lump that she feels. She denies any redness, hot to the touch area, or severe debilitating pain. Appts sch for [place] and PA to meet schedule with husband and childcare. Confirmed understanding.

## 2021-11-08 DIAGNOSIS — M5413 Radiculopathy, cervicothoracic region: Secondary | ICD-10-CM | POA: Diagnosis not present

## 2021-11-08 DIAGNOSIS — M546 Pain in thoracic spine: Secondary | ICD-10-CM | POA: Diagnosis not present

## 2021-11-08 DIAGNOSIS — M5442 Lumbago with sciatica, left side: Secondary | ICD-10-CM | POA: Diagnosis not present

## 2021-11-10 ENCOUNTER — Encounter (HOSPITAL_COMMUNITY): Payer: Federal, State, Local not specified - PPO

## 2021-11-10 ENCOUNTER — Ambulatory Visit: Payer: Federal, State, Local not specified - PPO

## 2021-11-25 NOTE — Progress Notes (Unsigned)
Requested by:  [facility], [place],  [place] [address]     History of Present Illness   Caitlin Aguirre is a 31 y.o. (1990/06/20) female who presents for reevaluation of varicose veins. She was seen July of 2022 with concerns about painful varicose veins and concerns about DVT. No DVT was found but she did have an incompetent GSV by duplex. She was measured for 20-30 mmHg thigh high compression stockings but she explains she did not purchase any from here. At the time she tried to order some online and the pair she got she says she did not tolerate wearing. The ablation was discussed with her at that visit but she was at the time not interested in any further treatment. Now she explains that her veins have been causing her more pain. She reports stabbing/ stinging pains intermittently and also dull aching in the left leg. The pain is mostly in area of bulging varicose veins in medial thigh and medial left calf. She says the pain occurs all throughout day. She is up on her legs a lot with three young kids. She infrequently elevates and does not wear compression. She does have family history of venous disease but no DVT.   Past Medical History:  Diagnosis Date   Hypothyroidism     Past Surgical History:  Procedure Laterality Date   DILATION AND CURETTAGE OF UTERUS      Social History   Socioeconomic History   Marital status: Married    Spouse name: Not on file   Number of children: Not on file   Years of education: Not on file   Highest education level: Not on file  Occupational History   Not on file  Tobacco Use   Smoking status: Never   Smokeless tobacco: Never  Substance and Sexual Activity   Alcohol use: Not Currently   Drug use: Never   Sexual activity: Not on file  Other Topics Concern   Not on file  Social History Narrative   Not on file   Social Determinants of Health   Financial Resource Strain: Not on file  Food Insecurity: Not on file   Transportation Needs: Not on file  Physical Activity: Not on file  Stress: Not on file  Social Connections: Not on file  Intimate Partner Violence: Not on file   History reviewed. No pertinent family history.  Current Outpatient Medications  Medication Sig Dispense Refill   levothyroxine (SYNTHROID) 25 MCG tablet Take 25 mcg by mouth daily before breakfast.     Prenatal Vit-Fe Fumarate-FA (PRENATAL MULTIVITAMIN) TABS tablet Take 1 tablet by mouth daily at 12 noon.     ibuprofen (ADVIL) 600 MG tablet Take 1 tablet (600 mg total) by mouth every 6 (six) hours. (Patient not taking: Reported on 09/17/2020) 30 tablet 0   No current facility-administered medications for this visit.    No Known Allergies  REVIEW OF SYSTEMS (negative unless checked):   Cardiac:  []  Chest pain or chest pressure? []  Shortness of breath upon activity? []  Shortness of breath when lying flat? []  Irregular heart rhythm?  Vascular:  []  Pain in calf, thigh, or hip brought on by walking? []  Pain in feet at night that wakes you up from your sleep? []  Blood clot in your veins? [x]  Leg swelling?  Pulmonary:  []  Oxygen at home? []  Productive cough? []  Wheezing?  Neurologic:  []  Sudden weakness in arms or legs? []  Sudden numbness in arms or  legs? []  Sudden onset of difficult speaking or slurred speech? []  Temporary loss of vision in one eye? []  Problems with dizziness?  Gastrointestinal:  []  Blood in stool? []  Vomited blood?  Genitourinary:  []  Burning when urinating? []  Blood in urine?  Psychiatric:  []  Major depression  Hematologic:  []  Bleeding problems? []  Problems with blood clotting?  Dermatologic:  []  Rashes or ulcers?  Constitutional:  []  Fever or chills?  Ear/Nose/Throat:  []  Change in hearing? []  Nose bleeds? []  Sore throat?  Musculoskeletal:  []  Back pain? []  Joint pain? []  Muscle pain?   Physical Examination     Vitals:   11/28/21 1052  BP: 115/73  Pulse: 77   Resp: 16  Temp: 97.7 F (36.5 C)  TempSrc: Temporal  SpO2: 100%  Weight: 170 lb (77.1 kg)  Height: 5\' 10"  (1.778 m)   Body mass index is 24.39 kg/m.  General:  WDWN in NAD; vital signs documented above Gait: Normal HENT: WNL, normocephalic Pulmonary: normal non-labored breathing , without wheezing Cardiac: regular HR, without  Murmurs  Vascular Exam/Pulses: 2+ distal pulses bilaterally Extremities: with varicose veins left medial thigh and left calf, without reticular veins, without edema, without stasis pigmentation, without lipodermatosclerosis, without ulcers Musculoskeletal: no muscle wasting or atrophy  Neurologic: A&O X 3;  No focal weakness or paresthesias are detected Psychiatric:  The pt has Normal affect.  Non-invasive Vascular Imaging   BLE Venous Insufficiency Duplex (11/28/21):  LLE: No DVT and SVT GSV reflux SFJ to proximal calf GSV diameter 0.47 cm - 1.63 cm SSV reflux popliteal fossa and proximal calf CFV deep venous reflux   Medical Decision Making   [name] is a 30 y.o. female who presents with: LLE chronic venous insufficiency with painful varicose veins. Duplex today shows no DVT or SVT. She does have deep and superficial reflux. Her GSV is incompetent throughout and is very large. She also has SSV reflux. Based on this study and her prior evaluations she could be a candidate for venous ablation. Her symptoms now are more significant. -Based on the patient's history and examination, I recommend: continue elevation daily, continue use of her thigh high compression stockings, exercise, refraining from prolonged sitting or standing - She was measured and fitted for knee high compression. I did however discuss with her that if she would like to have the ablation procedure in the future she would need to use thigh high 20-30 mmHg compression stockings. She would like to hold off at this time - She would like to discuss the ablation with her Husband and also  think about what she would like to do going forward. For now she will continue conservative therapy - Provided reassurance that she does not have a blood clot or anything that is limb or life threatening - She can follow up as needed if she has new or concerning symptoms or if she would like to proceed with an ablation   , PA-C [facility] Office: [phone]  11/28/2021, 11:38 AM  Clinic MD: 

## 2021-11-28 ENCOUNTER — Ambulatory Visit (HOSPITAL_COMMUNITY)
Admission: RE | Admit: 2021-11-28 | Discharge: 2021-11-28 | Disposition: A | Discharge status: Home / Self Care | Payer: Federal, State, Local not specified - PPO | Source: Ambulatory Visit | Attending: Vascular Surgery | Admitting: Vascular Surgery

## 2021-11-28 ENCOUNTER — Ambulatory Visit (INDEPENDENT_AMBULATORY_CARE_PROVIDER_SITE_OTHER): Payer: Federal, State, Local not specified - PPO | Admitting: Physician Assistant

## 2021-11-28 ENCOUNTER — Encounter: Payer: Self-pay | Admitting: Physician Assistant

## 2021-11-28 VITALS — BP 115/73 | HR 77 | Temp 97.7°F | Resp 16 | Ht 70.0 in | Wt 170.0 lb

## 2021-11-28 DIAGNOSIS — I872 Venous insufficiency (chronic) (peripheral): Secondary | ICD-10-CM | POA: Diagnosis not present

## 2021-11-28 DIAGNOSIS — I83812 Varicose veins of left lower extremities with pain: Secondary | ICD-10-CM

## 2022-01-25 DIAGNOSIS — Z3689 Encounter for other specified antenatal screening: Secondary | ICD-10-CM | POA: Diagnosis not present

## 2022-01-25 DIAGNOSIS — Z3200 Encounter for pregnancy test, result unknown: Secondary | ICD-10-CM | POA: Diagnosis not present

## 2022-02-06 DIAGNOSIS — Z3201 Encounter for pregnancy test, result positive: Secondary | ICD-10-CM | POA: Diagnosis not present

## 2022-02-13 DIAGNOSIS — Z3A10 10 weeks gestation of pregnancy: Secondary | ICD-10-CM | POA: Diagnosis not present

## 2022-02-13 DIAGNOSIS — Z3481 Encounter for supervision of other normal pregnancy, first trimester: Secondary | ICD-10-CM | POA: Diagnosis not present

## 2022-02-13 DIAGNOSIS — E039 Hypothyroidism, unspecified: Secondary | ICD-10-CM | POA: Diagnosis not present

## 2022-02-13 DIAGNOSIS — Z3689 Encounter for other specified antenatal screening: Secondary | ICD-10-CM | POA: Diagnosis not present

## 2022-02-13 DIAGNOSIS — Z118 Encounter for screening for other infectious and parasitic diseases: Secondary | ICD-10-CM | POA: Diagnosis not present

## 2022-02-13 DIAGNOSIS — O99281 Endocrine, nutritional and metabolic diseases complicating pregnancy, first trimester: Secondary | ICD-10-CM | POA: Diagnosis not present

## 2022-02-13 LAB — OB RESULTS CONSOLE GC/CHLAMYDIA
Chlamydia: NEGATIVE
Neisseria Gonorrhea: NEGATIVE

## 2022-02-13 LAB — OB RESULTS CONSOLE HIV ANTIBODY (ROUTINE TESTING): HIV: NONREACTIVE

## 2022-02-13 LAB — OB RESULTS CONSOLE RUBELLA ANTIBODY, IGM: Rubella: IMMUNE

## 2022-02-13 LAB — OB RESULTS CONSOLE HEPATITIS B SURFACE ANTIGEN: Hepatitis B Surface Ag: NEGATIVE

## 2022-02-13 LAB — OB RESULTS CONSOLE RPR: RPR: NONREACTIVE

## 2022-02-27 NOTE — L&D Delivery Note (Signed)
   Delivery Note:   [id number] at [redacted]w[redacted]d  Admitting diagnosis: Normal labor [O80, Z37.9] Risks: Hypothyroidism Onset of labor: 09/04/2022 at 1100 IOL/Augmentation: Cytotec ROM: 09/04/2022 at 0900, clear fluid  In tub at 1347, temp 99.30F  Complete dilation at 09/04/2022  1356 Onset of pushing at 1356  Analgesia/Anesthesia intrapartum:None  Pushing in hands and knees position in waterbirth tub with CNM and L&D staff support. Husband, Caitlin Aguirre, present for birth and supportive.  Delivery of a Live born female  Birth Weight:  pending APGAR: 8, 9   Newborn Delivery   Birth date/time: 09/04/2022 14:01:00 Delivery type: Vaginal, Spontaneous     in cephalic presentation, position OA to LOA.  APGAR:1 min-8 , 5 min-9   Nuchal Cord: Yes x 1 Cord double clamped after cessation of pulsation, cut by RN.  Collection of cord blood for typing completed. Arterial cord blood sample-No    Placenta delivered-Spontaneous  with 3 vessels . Uterotonics: None Placenta to L&D Uterine tone firm  Bleeding scant  None  laceration identified.  Episiotomy:None  Local analgesia: N/A  Repair: N/A Est. Blood Loss (mL):44.00   Complications: None  Mom to postpartum. Baby Caitlin Aguirre to Couplet care / Skin to Skin.  Delivery Report:   Review the Delivery Report for details.    June Leap, CNM, MSN 09/04/2022, 2:24 PM

## 2022-03-14 DIAGNOSIS — Z3A14 14 weeks gestation of pregnancy: Secondary | ICD-10-CM | POA: Diagnosis not present

## 2022-03-14 DIAGNOSIS — O99282 Endocrine, nutritional and metabolic diseases complicating pregnancy, second trimester: Secondary | ICD-10-CM | POA: Diagnosis not present

## 2022-04-11 DIAGNOSIS — Z363 Encounter for antenatal screening for malformations: Secondary | ICD-10-CM | POA: Diagnosis not present

## 2022-05-09 DIAGNOSIS — O99282 Endocrine, nutritional and metabolic diseases complicating pregnancy, second trimester: Secondary | ICD-10-CM | POA: Diagnosis not present

## 2022-05-09 DIAGNOSIS — Z3A22 22 weeks gestation of pregnancy: Secondary | ICD-10-CM | POA: Diagnosis not present

## 2022-05-09 DIAGNOSIS — Z361 Encounter for antenatal screening for raised alphafetoprotein level: Secondary | ICD-10-CM | POA: Diagnosis not present

## 2022-06-08 DIAGNOSIS — Z3689 Encounter for other specified antenatal screening: Secondary | ICD-10-CM | POA: Diagnosis not present

## 2022-06-27 DIAGNOSIS — Z23 Encounter for immunization: Secondary | ICD-10-CM | POA: Diagnosis not present

## 2022-07-19 DIAGNOSIS — Z3689 Encounter for other specified antenatal screening: Secondary | ICD-10-CM | POA: Diagnosis not present

## 2022-07-19 DIAGNOSIS — Z3A33 33 weeks gestation of pregnancy: Secondary | ICD-10-CM | POA: Diagnosis not present

## 2022-07-19 DIAGNOSIS — O99283 Endocrine, nutritional and metabolic diseases complicating pregnancy, third trimester: Secondary | ICD-10-CM | POA: Diagnosis not present

## 2022-07-19 LAB — OB RESULTS CONSOLE RPR: RPR: NONREACTIVE

## 2022-08-14 DIAGNOSIS — N898 Other specified noninflammatory disorders of vagina: Secondary | ICD-10-CM | POA: Diagnosis not present

## 2022-08-14 DIAGNOSIS — Z3685 Encounter for antenatal screening for Streptococcus B: Secondary | ICD-10-CM | POA: Diagnosis not present

## 2022-08-14 LAB — OB RESULTS CONSOLE GBS: GBS: NEGATIVE

## 2022-08-22 DIAGNOSIS — O99283 Endocrine, nutritional and metabolic diseases complicating pregnancy, third trimester: Secondary | ICD-10-CM | POA: Diagnosis not present

## 2022-08-22 DIAGNOSIS — Z3A38 38 weeks gestation of pregnancy: Secondary | ICD-10-CM | POA: Diagnosis not present

## 2022-09-04 ENCOUNTER — Other Ambulatory Visit: Payer: Self-pay

## 2022-09-04 ENCOUNTER — Inpatient Hospital Stay (HOSPITAL_COMMUNITY)
Admission: AD | Admit: 2022-09-04 | Discharge: 2022-09-05 | DRG: 807 | Disposition: A | Discharge status: Home / Self Care | Payer: Federal, State, Local not specified - PPO | Attending: Obstetrics | Admitting: Obstetrics

## 2022-09-04 ENCOUNTER — Encounter (HOSPITAL_COMMUNITY): Payer: Self-pay

## 2022-09-04 DIAGNOSIS — Z3A39 39 weeks gestation of pregnancy: Secondary | ICD-10-CM

## 2022-09-04 DIAGNOSIS — E039 Hypothyroidism, unspecified: Secondary | ICD-10-CM | POA: Diagnosis not present

## 2022-09-04 DIAGNOSIS — Z2882 Immunization not carried out because of caregiver refusal: Secondary | ICD-10-CM | POA: Diagnosis not present

## 2022-09-04 DIAGNOSIS — O99284 Endocrine, nutritional and metabolic diseases complicating childbirth: Secondary | ICD-10-CM | POA: Diagnosis present

## 2022-09-04 DIAGNOSIS — Z79890 Hormone replacement therapy (postmenopausal): Secondary | ICD-10-CM | POA: Diagnosis not present

## 2022-09-04 DIAGNOSIS — O26893 Other specified pregnancy related conditions, third trimester: Secondary | ICD-10-CM | POA: Diagnosis not present

## 2022-09-04 LAB — RPR: RPR Ser Ql: NONREACTIVE

## 2022-09-04 LAB — CBC
HCT: 39.4 % (ref 36.0–46.0)
Hemoglobin: 12.4 g/dL (ref 12.0–15.0)
MCH: 25.9 pg — ABNORMAL LOW (ref 26.0–34.0)
MCHC: 31.5 g/dL (ref 30.0–36.0)
MCV: 82.3 fL (ref 80.0–100.0)
Platelets: 171 10*3/uL (ref 150–400)
RBC: 4.79 MIL/uL (ref 3.87–5.11)
RDW: 13.8 % (ref 11.5–15.5)
WBC: 9.1 10*3/uL (ref 4.0–10.5)
nRBC: 0 % (ref 0.0–0.2)

## 2022-09-04 LAB — TYPE AND SCREEN
ABO/RH(D): O POS
Antibody Screen: NEGATIVE

## 2022-09-04 MED ORDER — SODIUM CHLORIDE 0.9% FLUSH: 3.0000 mL | INTRAVENOUS | Status: DC | PRN

## 2022-09-04 MED ORDER — DIBUCAINE (PERIANAL) 1 % EX OINT: 1.0000 | TOPICAL_OINTMENT | CUTANEOUS | Status: DC | PRN

## 2022-09-04 MED ORDER — LACTATED RINGERS IV SOLN: INTRAVENOUS | Status: DC

## 2022-09-04 MED ORDER — WITCH HAZEL-GLYCERIN EX PADS: 1.0000 | MEDICATED_PAD | CUTANEOUS | Status: DC | PRN

## 2022-09-04 MED ORDER — ZOLPIDEM TARTRATE 5 MG PO TABS: 5.0000 mg | ORAL_TABLET | Freq: Every evening | ORAL | Status: DC | PRN

## 2022-09-04 MED ORDER — OXYTOCIN BOLUS FROM INFUSION: 333.0000 mL | Freq: Once | INTRAVENOUS | Status: DC

## 2022-09-04 MED ORDER — PROMETHAZINE HCL 25 MG PO TABS
12.5000 mg | ORAL_TABLET | Freq: Four times a day (QID) | ORAL | Status: DC | PRN
  Administered 2022-09-04: 12.5 mg via ORAL
  Filled 2022-09-04: qty 1

## 2022-09-04 MED ORDER — SIMETHICONE 80 MG PO CHEW: 80.0000 mg | CHEWABLE_TABLET | ORAL | Status: DC | PRN

## 2022-09-04 MED ORDER — SENNOSIDES-DOCUSATE SODIUM 8.6-50 MG PO TABS
2.0000 | ORAL_TABLET | Freq: Every day | ORAL | Status: DC
  Administered 2022-09-05: 2 via ORAL
  Filled 2022-09-04: qty 2

## 2022-09-04 MED ORDER — LIDOCAINE HCL (PF) 1 % IJ SOLN: 30.0000 mL | INTRAMUSCULAR | Status: DC | PRN

## 2022-09-04 MED ORDER — ONDANSETRON HCL 4 MG/2ML IJ SOLN: 4.0000 mg | Freq: Four times a day (QID) | INTRAMUSCULAR | Status: DC | PRN

## 2022-09-04 MED ORDER — OXYTOCIN-SODIUM CHLORIDE 30-0.9 UT/500ML-% IV SOLN: 2.5000 [IU]/h | INTRAVENOUS | Status: DC

## 2022-09-04 MED ORDER — LEVOTHYROXINE SODIUM 50 MCG PO TABS
25.0000 ug | ORAL_TABLET | Freq: Every day | ORAL | Status: DC
  Administered 2022-09-05: 25 ug via ORAL
  Filled 2022-09-04: qty 1

## 2022-09-04 MED ORDER — ONDANSETRON HCL 4 MG/2ML IJ SOLN: 4.0000 mg | INTRAMUSCULAR | Status: DC | PRN

## 2022-09-04 MED ORDER — PROMETHAZINE HCL 12.5 MG RE SUPP: 12.5000 mg | Freq: Four times a day (QID) | RECTAL | Status: DC | PRN

## 2022-09-04 MED ORDER — MISOPROSTOL 50MCG HALF TABLET
50.0000 ug | ORAL_TABLET | ORAL | Status: DC | PRN
  Administered 2022-09-04: 50 ug via BUCCAL
  Filled 2022-09-04: qty 1

## 2022-09-04 MED ORDER — FENTANYL CITRATE (PF) 100 MCG/2ML IJ SOLN: 50.0000 ug | INTRAMUSCULAR | Status: DC | PRN

## 2022-09-04 MED ORDER — IBUPROFEN 600 MG PO TABS
600.0000 mg | ORAL_TABLET | Freq: Four times a day (QID) | ORAL | Status: DC
  Administered 2022-09-04 – 2022-09-05 (×4): 600 mg via ORAL
  Filled 2022-09-04 (×4): qty 1

## 2022-09-04 MED ORDER — ACETAMINOPHEN 500 MG PO TABS: 1000.0000 mg | ORAL_TABLET | Freq: Four times a day (QID) | ORAL | Status: DC | PRN

## 2022-09-04 MED ORDER — SODIUM CHLORIDE 0.9% FLUSH: 3.0000 mL | Freq: Two times a day (BID) | INTRAVENOUS | Status: DC

## 2022-09-04 MED ORDER — ONDANSETRON 4 MG PO TBDP
4.0000 mg | ORAL_TABLET | Freq: Three times a day (TID) | ORAL | Status: DC | PRN
  Administered 2022-09-04: 4 mg via ORAL

## 2022-09-04 MED ORDER — ONDANSETRON 4 MG PO TBDP
ORAL_TABLET | ORAL | Status: AC
  Filled 2022-09-04: qty 1

## 2022-09-04 MED ORDER — SOD CITRATE-CITRIC ACID 500-334 MG/5ML PO SOLN: 30.0000 mL | ORAL | Status: DC | PRN

## 2022-09-04 MED ORDER — ONDANSETRON HCL 4 MG PO TABS
4.0000 mg | ORAL_TABLET | ORAL | Status: DC | PRN
  Administered 2022-09-04: 4 mg via ORAL
  Filled 2022-09-04 (×2): qty 1

## 2022-09-04 MED ORDER — DIPHENHYDRAMINE HCL 25 MG PO CAPS: 25.0000 mg | ORAL_CAPSULE | Freq: Four times a day (QID) | ORAL | Status: DC | PRN

## 2022-09-04 MED ORDER — ACETAMINOPHEN 325 MG PO TABS
650.0000 mg | ORAL_TABLET | ORAL | Status: DC | PRN
  Administered 2022-09-04 – 2022-09-05 (×4): 650 mg via ORAL
  Filled 2022-09-04 (×4): qty 2

## 2022-09-04 MED ORDER — LACTATED RINGERS IV SOLN: 500.0000 mL | INTRAVENOUS | Status: DC | PRN

## 2022-09-04 MED ORDER — TETANUS-DIPHTH-ACELL PERTUSSIS 5-2.5-18.5 LF-MCG/0.5 IM SUSY: 0.5000 mL | PREFILLED_SYRINGE | Freq: Once | INTRAMUSCULAR | Status: DC

## 2022-09-04 MED ORDER — BENZOCAINE-MENTHOL 20-0.5 % EX AERO: 1.0000 | INHALATION_SPRAY | CUTANEOUS | Status: DC | PRN

## 2022-09-04 MED ORDER — TERBUTALINE SULFATE 1 MG/ML IJ SOLN: 0.2500 mg | Freq: Once | INTRAMUSCULAR | Status: DC | PRN

## 2022-09-04 MED ORDER — SODIUM CHLORIDE 0.9 % IV SOLN: INTRAVENOUS | Status: DC | PRN

## 2022-09-04 MED ORDER — PRENATAL MULTIVITAMIN CH
1.0000 | ORAL_TABLET | Freq: Every day | ORAL | Status: DC
  Administered 2022-09-05: 1 via ORAL
  Filled 2022-09-04: qty 1

## 2022-09-04 MED ORDER — COCONUT OIL OIL: 1.0000 | TOPICAL_OIL | Status: DC | PRN

## 2022-09-04 MED ORDER — OXYTOCIN 10 UNIT/ML IJ SOLN: 10.0000 [IU] | Freq: Once | INTRAMUSCULAR | Status: DC

## 2022-09-04 NOTE — Progress Notes (Signed)
S: Reports contractions now 1-2 minutes apart. Requests VE. Husband, Ariel, present and supportive.   O: Vitals:   09/04/22 1023 09/04/22 1304  BP: 108/77 123/75  Pulse: 74 65  Resp: 18   Temp: 98 F (36.7 C)   TempSrc: Oral   Weight: 90.3 kg   Height: 5\' 10"  (1.778 m)    FHT:  FHR: 120 bpm, variability: moderate,  accelerations:  Absent,  decelerations:  Present early UC:   regular, every 1-2 minutes SVE:   Dilation: 5 Effacement (%): 80 Station: -2 Exam by:: Brealyn Baril, CNM  A / P: Augmentation of labor, progressing well  Fetal Wellbeing:   Reassuring, intermittent monitoring GBS: Negative Pain Control:  Labor support without medications Anticipated MOD:  NSVD  Plan tub immersion when patient desires.   June Leap, CNM, MSN 09/04/2022, 1:43 PM

## 2022-09-04 NOTE — Lactation Note (Signed)
This note was copied from a baby's chart. Lactation Consultation Note  Patient Name: Caitlin Aguirre [id number] Date: 09/04/2022 Age:34 hours Reason for consult: Initial assessment;Term;Maternal endocrine disorder Mom holding baby stated she tried to BF him but he wouldn't feed. LC attempted to get baby to latch and he wasn't interested. Baby started trying to spit up. Baby had 3 emesis while LC in rm. FOB got up and held baby. LC had cleared baby's mouth each time w/bulb syringe. Mom is still very nauseated. Not feeling well but has tried to BF every 3 hrs. Baby hasn't fed since 1800.  Explained since baby is spitting up so much that is why he isn't hungry. Encouraged every 3 hrs and w/cues.  Mom BF each one of her children for average of 2 yrs each. Last one is now 15 yrs old. Mom encouraged to feed baby 8-12 times/24 hours and with feeding cues.  Encouraged mom to feel better and call for [facility] if she needs assistance or has questions.  Maternal Data Does the patient have breastfeeding experience prior to this delivery?: Yes How long did the patient breastfeed?: average of 2 yrs each X 3 other children. youngest 8 yrs old  Feeding    LATCH Score Latch: Too sleepy or reluctant, no latch achieved, no sucking elicited.  Audible Swallowing: None  Type of Nipple: Everted at rest and after stimulation  Comfort (Breast/Nipple): Soft / non-tender  Hold (Positioning): Assistance needed to correctly position infant at breast and maintain latch.  LATCH Score: 5   Lactation Tools Discussed/Used    Interventions Interventions: Breast feeding basics reviewed;Support pillows;Assisted with latch;Skin to skin;Position options;Adjust position;LC Services brochure  Discharge    Consult Status Consult Status: Follow-up Date: 09/05/22 Follow-up type: In-patient    Charyl Dancer 09/04/2022, 11:18 PM

## 2022-09-04 NOTE — H&P (Signed)
   OB ADMISSION/ HISTORY & PHYSICAL:  Admission Date: 09/04/2022  9:40 AM  Admit Diagnosis: Normal labor [O80, Z37.9]    Caitlin Aguirre is a 32 y.o. female [phone] at [redacted]w[redacted]d presenting for ROM. Reports contractions this morning and SROM at the office for regularly scheduled visit. Confirmed ROM in office. Reports occasional contractions. Denies vaginal bleeding. Endorses + fetal movement. Planning repeat waterbirth. Husband, Ariel, present and supportive. Eagerly anticipating baby boy "Gladstone Pih".   Prenatal History: [id number]   EDC: 09/06/2022 [facility]atal care at [facility] since 10 weeks  Primary: D. Renae Fickle, CNM  Prenatal course complicated by: Hypothyroidism, stable on levothyroxine History of PPH with retained placenta with G2, no blood transfusion  Prenatal Labs: ABO, Rh:   O POS Antibody:   Negative Rubella:   Immune RPR:   Non-reactive HBsAg:   Negative HIV:   Negative GBS:   Negative 1 hr Glucola : 78 Genetic Screening: Declined Ultrasound: normal XY anatomy, anterior placenta    Maternal Diabetes: No Genetic Screening: Declined Maternal Ultrasounds/Referrals: Normal Fetal Ultrasounds or other Referrals:  None Maternal Substance Abuse:  No Significant Maternal Medications:  levothyroxine Significant Maternal Lab Results:  Group B Strep negative Other Comments:  None  Medical / Surgical History : Past medical history:  Past Medical History:  Diagnosis Date   Hypothyroidism     Past surgical history:  Past Surgical History:  Procedure Laterality Date   DILATION AND CURETTAGE OF UTERUS      Family History: No family history on file.  Social History:  reports that she has never smoked. She has never used smokeless tobacco. She reports that she does not currently use alcohol. She reports that she does not use drugs.  Allergies: Patient has no known allergies.   Current Medications at time of admission:  Medications Prior to Admission  Medication Sig  Dispense Refill Last Dose   levothyroxine (SYNTHROID) 25 MCG tablet Take 25 mcg by mouth daily before breakfast.   09/04/2022   Prenatal Vit-Fe Fumarate-FA (PRENATAL MULTIVITAMIN) TABS tablet Take 1 tablet by mouth daily at 12 noon.   09/03/2022   ibuprofen (ADVIL) 600 MG tablet Take 1 tablet (600 mg total) by mouth every 6 (six) hours. (Patient not taking: Reported on 09/17/2020) 30 tablet 0     Review of Systems: Review of Systems  All other systems reviewed and are negative.  Physical Exam: Vital signs and nursing notes reviewed.  No data found.   General: AAO x 3, NAD Heart: RRR Lungs:CTAB Abdomen: Gravid, NT Extremities: no edema SVE:   deferred 4/50/-3 in office this AM  FHR: 125BPM, moderate variability, + accels, no decels TOCO: Contractions   Labs:   No results for input(s): "WBC", "HGB", "HCT", "PLT" in the last 72 hours.  Assessment/Plan: 32 y.o. [id number] at [redacted]w[redacted]d, early labor with SROM  Fetal wellbeing - FHT category 1 EFW AGA 8-9lbs  Labor: Plan buccal Cytotec q 4 hours until active labor  GBS negative Rubella immune Rh positive  Pain control: desires unmedicated waterbirth, class complete, 2 prior waterbirths Analgesia/anesthesia PRN  Anticipated MOD: NSVB  Plans to breastfeed, plans inpatient circumcision. POC discussed with patient and support team, all questions answered.  Dr. Ernestina Penna notified of admission/plan of care.  June Leap CNM, MSN 09/04/2022, 10:33 AM

## 2022-09-05 LAB — CBC
HCT: 36.6 % (ref 36.0–46.0)
Hemoglobin: 11.8 g/dL — ABNORMAL LOW (ref 12.0–15.0)
MCH: 25.9 pg — ABNORMAL LOW (ref 26.0–34.0)
MCHC: 32.2 g/dL (ref 30.0–36.0)
MCV: 80.4 fL (ref 80.0–100.0)
Platelets: 177 10*3/uL (ref 150–400)
RBC: 4.55 MIL/uL (ref 3.87–5.11)
RDW: 13.8 % (ref 11.5–15.5)
WBC: 11.7 10*3/uL — ABNORMAL HIGH (ref 4.0–10.5)
nRBC: 0 % (ref 0.0–0.2)

## 2022-09-05 NOTE — Plan of Care (Signed)

## 2022-09-05 NOTE — Discharge Summary (Signed)
OB Discharge Summary  Patient Name: Caitlin Aguirre DOB: [birth year] MRN: [id number]  Date of admission: 09/04/2022 Delivering provider: Dorisann Frames K   Admitting diagnosis: Normal labor [O80, Z37.9] Intrauterine pregnancy: [redacted]w[redacted]d     Secondary diagnosis: Patient Active Problem List   Diagnosis Date Noted   Normal labor 09/04/2022   SVD/Waterbirth 09/04/2022   Hypothyroidism 09/04/2022   Postpartum care following vaginal delivery 7/8 09/04/2022    Date of discharge: 09/05/2022   Discharge diagnosis: Principal Problem:   Postpartum care following vaginal delivery 7/8 Active Problems:   Normal labor   SVD/Waterbirth   Hypothyroidism                                                       Augmentation: Cytotec Pain control: None  Laceration:None  Complications: None  Hospital course:  Onset of Labor With Vaginal Delivery      32 y.o. yo [id number] at [redacted]w[redacted]d was admitted in Latent Labor on 09/04/2022.  Membrane Rupture Time/Date: 9:00 AM ,09/04/2022   Delivery Method:Vaginal, Spontaneous  Episiotomy: None  Lacerations:  None  Patient had an uncomplicated postpartum course. She is ambulating, tolerating a regular diet, passing flatus, and urinating well. Patient is discharged home in stable condition on 09/05/22.  Newborn Data: Birth date:09/04/2022  Birth time:2:01 PM  Gender:Female  Living status:Living  Apgars:8 ,9  Weight:3330 g   Physical exam  Vitals:   09/04/22 2054 09/04/22 2212 09/05/22 0109 09/05/22 0500  BP: 130/88 120/79 115/74 107/68  Pulse: (!) 53 (!) 56 65 (!) 51  Resp: 18  16 16   Temp: 98.4 F (36.9 C)  98.5 F (36.9 C) 97.8 F (36.6 C)  TempSrc: Oral  Oral Oral  SpO2: 100%  97% 98%  Weight:      Height:       General: alert and cooperative Lochia: appropriate Uterine Fundus: firm Perineum: intact DVT Evaluation: No evidence of DVT seen on physical exam.  Labs: Lab Results  Component Value Date   WBC 11.7 (H) 09/05/2022   HGB 11.8 (L) 09/05/2022   HCT  36.6 09/05/2022   MCV 80.4 09/05/2022   PLT 177 09/05/2022      09/04/2022    4:01 PM 06/05/2019    4:00 PM  Edinburgh Postnatal Depression Scale Screening Tool  I have been able to laugh and see the funny side of things. 0 0  I have looked forward with enjoyment to things. 0 0  I have blamed myself unnecessarily when things went wrong. 1 0  I have been anxious or worried for no good reason. 0 0  I have felt scared or panicky for no good reason. 0 0  Things have been getting on top of me. 0 0  I have been so unhappy that I have had difficulty sleeping. 0 0  I have felt sad or miserable. 0 0  I have been so unhappy that I have been crying. 0 0  The thought of harming myself has occurred to me. 0 0  Edinburgh Postnatal Depression Scale Total 1 0   Discharge instructions:  per After Visit Summary  After Visit Meds:  Allergies as of 09/05/2022   No Known Allergies      Medication List     STOP taking these medications    ibuprofen 600 MG tablet Commonly  known as: ADVIL       TAKE these medications    levothyroxine 25 MCG tablet Commonly known as: SYNTHROID Take 25 mcg by mouth daily before breakfast.   prenatal multivitamin Tabs tablet Take 1 tablet by mouth daily at 12 noon.       Activity: Advance as tolerated. Pelvic rest for 6 weeks.   Newborn Data: Live born female  Birth Weight: 7 lb 5.5 oz (3330 g) APGAR: 8, 9  Newborn Delivery   Birth date/time: 09/04/2022 14:01:00 Delivery type: Vaginal, Spontaneous     Named Gladstone Pih Baby Feeding: Breast Circumcision: Planning outpatient  Disposition:home with mother  Delivery Report:  Review the Delivery Report for details.    Follow up:  Follow-up Information     June Leap, CNM. Go in 6 week(s).   Specialty: Certified Nurse Midwife Contact information: [address] [place] [address] [phone]                June Leap, CNM, MSN 09/05/2022, 11:24 AM

## 2022-09-05 NOTE — Discharge Instructions (Signed)
WHAT TO LOOK OUT FOR: Fever of 100.4 or above Mastitis: feels like flu and breasts hurt Infection: increased pain, swelling or redness Blood clots golf ball size or larger Postpartum depression   Congratulations on your newest addition! 

## 2022-09-05 NOTE — Plan of Care (Signed)
Problem: Education: Goal: Knowledge of General Education information will improve Description: Including pain rating scale, medication(s)/side effects and non-pharmacologic comfort measures 09/05/2022 1250 by Donne Hazel, LPN Outcome: Adequate for Discharge 09/05/2022 0735 by Donne Hazel, LPN Outcome: Progressing   Problem: Health Behavior/Discharge Planning: Goal: Ability to manage health-related needs will improve 09/05/2022 1250 by Donne Hazel, LPN Outcome: Adequate for Discharge 09/05/2022 0735 by Donne Hazel, LPN Outcome: Progressing   Problem: Clinical Measurements: Goal: Ability to maintain clinical measurements within normal limits will improve 09/05/2022 1250 by Donne Hazel, LPN Outcome: Adequate for Discharge 09/05/2022 0735 by Donne Hazel, LPN Outcome: Progressing Goal: Will remain free from infection 09/05/2022 1250 by Donne Hazel, LPN Outcome: Adequate for Discharge 09/05/2022 0735 by Donne Hazel, LPN Outcome: Progressing Goal: Diagnostic test results will improve 09/05/2022 1250 by Donne Hazel, LPN Outcome: Adequate for Discharge 09/05/2022 0735 by Donne Hazel, LPN Outcome: Progressing Goal: Respiratory complications will improve 09/05/2022 1250 by Donne Hazel, LPN Outcome: Adequate for Discharge 09/05/2022 0735 by Donne Hazel, LPN Outcome: Progressing Goal: Cardiovascular complication will be avoided 09/05/2022 1250 by Donne Hazel, LPN Outcome: Adequate for Discharge 09/05/2022 0735 by Donne Hazel, LPN Outcome: Progressing   Problem: Activity: Goal: Risk for activity intolerance will decrease 09/05/2022 1250 by Donne Hazel, LPN Outcome: Adequate for Discharge 09/05/2022 0735 by Donne Hazel, LPN Outcome: Progressing   Problem: Nutrition: Goal: Adequate nutrition will be maintained 09/05/2022 1250 by Donne Hazel, LPN Outcome: Adequate for Discharge 09/05/2022 0735 by Donne Hazel, LPN Outcome: Progressing   Problem:  Coping: Goal: Level of anxiety will decrease 09/05/2022 1250 by Donne Hazel, LPN Outcome: Adequate for Discharge 09/05/2022 0735 by Donne Hazel, LPN Outcome: Progressing   Problem: Elimination: Goal: Will not experience complications related to bowel motility 09/05/2022 1250 by Donne Hazel, LPN Outcome: Adequate for Discharge 09/05/2022 0735 by Donne Hazel, LPN Outcome: Progressing Goal: Will not experience complications related to urinary retention 09/05/2022 1250 by Donne Hazel, LPN Outcome: Adequate for Discharge 09/05/2022 0735 by Donne Hazel, LPN Outcome: Progressing   Problem: Pain Managment: Goal: General experience of comfort will improve 09/05/2022 1250 by Donne Hazel, LPN Outcome: Adequate for Discharge 09/05/2022 0735 by Donne Hazel, LPN Outcome: Progressing   Problem: Safety: Goal: Ability to remain free from injury will improve 09/05/2022 1250 by Donne Hazel, LPN Outcome: Adequate for Discharge 09/05/2022 0735 by Donne Hazel, LPN Outcome: Progressing   Problem: Skin Integrity: Goal: Risk for impaired skin integrity will decrease 09/05/2022 1250 by Donne Hazel, LPN Outcome: Adequate for Discharge 09/05/2022 0735 by Donne Hazel, LPN Outcome: Progressing   Problem: Education: Goal: Knowledge of Childbirth will improve 09/05/2022 1250 by Donne Hazel, LPN Outcome: Adequate for Discharge 09/05/2022 0735 by Donne Hazel, LPN Outcome: Progressing Goal: Ability to make informed decisions regarding treatment and plan of care will improve 09/05/2022 1250 by Donne Hazel, LPN Outcome: Adequate for Discharge 09/05/2022 0735 by Donne Hazel, LPN Outcome: Progressing Goal: Ability to state and carry out methods to decrease the pain will improve 09/05/2022 1250 by Donne Hazel, LPN Outcome: Adequate for Discharge 09/05/2022 0735 by Donne Hazel, LPN Outcome: Progressing Goal: Individualized Educational Video(s) 09/05/2022 1250 by Donne Hazel,  LPN Outcome: Adequate for Discharge 09/05/2022 0735 by Donne Hazel, LPN Outcome: Progressing   Problem: Coping: Goal: Ability to verbalize concerns and feelings  about labor and delivery will improve 09/05/2022 1250 by Donne Hazel, LPN Outcome: Adequate for Discharge 09/05/2022 0735 by Donne Hazel, LPN Outcome: Progressing   Problem: Life Cycle: Goal: Ability to make normal progression through stages of labor will improve 09/05/2022 1250 by Donne Hazel, LPN Outcome: Adequate for Discharge 09/05/2022 0735 by Donne Hazel, LPN Outcome: Progressing Goal: Ability to effectively push during vaginal delivery will improve 09/05/2022 1250 by Donne Hazel, LPN Outcome: Adequate for Discharge 09/05/2022 0735 by Donne Hazel, LPN Outcome: Progressing   Problem: Role Relationship: Goal: Will demonstrate positive interactions with the child 09/05/2022 1250 by Donne Hazel, LPN Outcome: Adequate for Discharge 09/05/2022 0735 by Donne Hazel, LPN Outcome: Progressing   Problem: Safety: Goal: Risk of complications during labor and delivery will decrease 09/05/2022 1250 by Donne Hazel, LPN Outcome: Adequate for Discharge 09/05/2022 0735 by Donne Hazel, LPN Outcome: Progressing   Problem: Pain Management: Goal: Relief or control of pain from uterine contractions will improve 09/05/2022 1250 by Donne Hazel, LPN Outcome: Adequate for Discharge 09/05/2022 0735 by Donne Hazel, LPN Outcome: Progressing   Problem: Education: Goal: Knowledge of condition will improve 09/05/2022 1250 by Donne Hazel, LPN Outcome: Adequate for Discharge 09/05/2022 0735 by Donne Hazel, LPN Outcome: Progressing Goal: Individualized Educational Video(s) 09/05/2022 1250 by Donne Hazel, LPN Outcome: Adequate for Discharge 09/05/2022 0735 by Donne Hazel, LPN Outcome: Progressing Goal: Individualized Newborn Educational Video(s) 09/05/2022 1250 by Donne Hazel, LPN Outcome: Adequate for  Discharge 09/05/2022 0735 by Donne Hazel, LPN Outcome: Progressing   Problem: Activity: Goal: Will verbalize the importance of balancing activity with adequate rest periods 09/05/2022 1250 by Donne Hazel, LPN Outcome: Adequate for Discharge 09/05/2022 0735 by Donne Hazel, LPN Outcome: Progressing Goal: Ability to tolerate increased activity will improve 09/05/2022 1250 by Donne Hazel, LPN Outcome: Adequate for Discharge 09/05/2022 0735 by Donne Hazel, LPN Outcome: Progressing   Problem: Coping: Goal: Ability to identify and utilize available resources and services will improve 09/05/2022 1250 by Donne Hazel, LPN Outcome: Adequate for Discharge 09/05/2022 0735 by Donne Hazel, LPN Outcome: Progressing   Problem: Life Cycle: Goal: Chance of risk for complications during the postpartum period will decrease 09/05/2022 1250 by Donne Hazel, LPN Outcome: Adequate for Discharge 09/05/2022 0735 by Donne Hazel, LPN Outcome: Progressing   Problem: Role Relationship: Goal: Ability to demonstrate positive interaction with newborn will improve 09/05/2022 1250 by Donne Hazel, LPN Outcome: Adequate for Discharge 09/05/2022 0735 by Donne Hazel, LPN Outcome: Progressing   Problem: Skin Integrity: Goal: Demonstration of wound healing without infection will improve 09/05/2022 1250 by Donne Hazel, LPN Outcome: Adequate for Discharge 09/05/2022 0735 by Donne Hazel, LPN Outcome: Progressing

## 2022-09-30 ENCOUNTER — Telehealth (HOSPITAL_COMMUNITY): Payer: Self-pay | Admitting: *Deleted

## 2022-09-30 NOTE — Telephone Encounter (Signed)
09/30/2022  Name: Caitlin Aguirre MRN: [id number] DOB: 1990-11-22  Reason for Call:  Transition of Care Hospital Discharge Call  Contact Status: Patient Contact Status: Complete  Language assistant needed:          Follow-Up Questions: Do You Have Any Concerns About Your Health As You Heal From Delivery?: No Do You Have Any Concerns About Your Infants Health?: No  Edinburgh Postnatal Depression Scale:  In the Past 7 Days: I have been able to laugh and see the funny side of things.: As much as I always could I have looked forward with enjoyment to things.: As much as I ever did I have blamed myself unnecessarily when things went wrong.: Not very often I have been anxious or worried for no good reason.: Hardly ever I have felt scared or panicky for no good reason.: No, not at all Things have been getting on top of me.: No, most of the time I have coped quite well I have been so unhappy that I have had difficulty sleeping.: Not at all I have felt sad or miserable.: Not very often I have been so unhappy that I have been crying.: Only occasionally The thought of harming myself has occurred to me.: Never Inocente Salles Postnatal Depression Scale Total: 5  PHQ2-9 Depression Scale:     Discharge Follow-up: Edinburgh score requires follow up?: No Patient was advised of the following resources:: Breastfeeding Support Group, Support Group  Post-discharge interventions: Reviewed Newborn Safe Sleep Practices  Signature Deforest Hoyles, RN, [phone]

## 2022-10-19 DIAGNOSIS — Z1331 Encounter for screening for depression: Secondary | ICD-10-CM | POA: Diagnosis not present

## 2022-12-05 DIAGNOSIS — Z1329 Encounter for screening for other suspected endocrine disorder: Secondary | ICD-10-CM | POA: Diagnosis not present

## 2022-12-05 DIAGNOSIS — Z131 Encounter for screening for diabetes mellitus: Secondary | ICD-10-CM | POA: Diagnosis not present

## 2022-12-05 DIAGNOSIS — Z1331 Encounter for screening for depression: Secondary | ICD-10-CM | POA: Diagnosis not present

## 2022-12-05 DIAGNOSIS — Z01419 Encounter for gynecological examination (general) (routine) without abnormal findings: Secondary | ICD-10-CM | POA: Diagnosis not present

## 2022-12-05 DIAGNOSIS — Z124 Encounter for screening for malignant neoplasm of cervix: Secondary | ICD-10-CM | POA: Diagnosis not present

## 2022-12-05 DIAGNOSIS — Z13220 Encounter for screening for lipoid disorders: Secondary | ICD-10-CM | POA: Diagnosis not present

## 2022-12-05 DIAGNOSIS — Z0000 Encounter for general adult medical examination without abnormal findings: Secondary | ICD-10-CM | POA: Diagnosis not present

## 2023-03-02 DIAGNOSIS — M5413 Radiculopathy, cervicothoracic region: Secondary | ICD-10-CM | POA: Diagnosis not present

## 2023-03-02 DIAGNOSIS — M5442 Lumbago with sciatica, left side: Secondary | ICD-10-CM | POA: Diagnosis not present

## 2023-03-02 DIAGNOSIS — M546 Pain in thoracic spine: Secondary | ICD-10-CM | POA: Diagnosis not present

## 2023-03-06 DIAGNOSIS — M5413 Radiculopathy, cervicothoracic region: Secondary | ICD-10-CM | POA: Diagnosis not present

## 2023-03-06 DIAGNOSIS — M5442 Lumbago with sciatica, left side: Secondary | ICD-10-CM | POA: Diagnosis not present

## 2023-03-06 DIAGNOSIS — M546 Pain in thoracic spine: Secondary | ICD-10-CM | POA: Diagnosis not present
